# Patient Record
Sex: Male | Born: 1965
Health system: Southern US, Community
[De-identification: ages and names within clinical notes are randomized; demographics above are authoritative.]

## PROBLEM LIST (undated history)

## (undated) DIAGNOSIS — G43909 Migraine, unspecified, not intractable, without status migrainosus: Secondary | ICD-10-CM

## (undated) DIAGNOSIS — M7710 Lateral epicondylitis, unspecified elbow: Secondary | ICD-10-CM

## (undated) HISTORY — PX: APPENDECTOMY: SHX54

## (undated) HISTORY — PX: TONSILLECTOMY: SUR1361

## (undated) HISTORY — PX: OTHER SURGICAL HISTORY: SHX169

---

## 2012-02-02 ENCOUNTER — Other Ambulatory Visit (HOSPITAL_COMMUNITY): Payer: Self-pay | Admitting: Internal Medicine

## 2012-02-02 DIAGNOSIS — R109 Unspecified abdominal pain: Secondary | ICD-10-CM

## 2012-02-06 ENCOUNTER — Ambulatory Visit (HOSPITAL_COMMUNITY)
Admission: RE | Admit: 2012-02-06 | Discharge: 2012-02-06 | Disposition: A | Discharge status: Home / Self Care | Payer: PRIVATE HEALTH INSURANCE | Source: Ambulatory Visit | Attending: Internal Medicine | Admitting: Internal Medicine

## 2012-02-06 DIAGNOSIS — R1031 Right lower quadrant pain: Secondary | ICD-10-CM | POA: Insufficient documentation

## 2012-02-06 DIAGNOSIS — R109 Unspecified abdominal pain: Secondary | ICD-10-CM

## 2013-11-07 ENCOUNTER — Encounter (HOSPITAL_COMMUNITY): Payer: Self-pay | Admitting: Pharmacy Technician

## 2013-11-07 NOTE — H&P (Signed)
  NTS SOAP Note  Vital Signs:  Vitals as of: 4/56/2563: Systolic 893: Diastolic 92: Heart Rate 68: Temp 98.39F: Height 46ft 9in: Weight 185Lbs 0 Ounces: BMI 27.32  BMI : 27.32 kg/m2  Subjective: This 64 Years 85 Months old Male presents for a screening TCS.  Never has had a colonoscopy.  No gi complaints.  No family h/o colon cancer.  Review of Symptoms:  Constitutional:unremarkable   Head:unremarkable    Eyes:unremarkable   Nose/Mouth/Throat:unremarkable Cardiovascular:  unremarkable   Respiratory:unremarkable   Gastrointestinal:  unremarkable   Genitourinary:unremarkable     Musculoskeletal:unremarkable   Skin:unremarkable Hematolgic/Lymphatic:unremarkable     Allergic/Immunologic:unremarkable     Past Medical History:    Reviewed  Past Medical History  Surgical History: appendectomy Medical Problems: unremarkable Allergies: nkda Medications: voltaren, baby asa ultracet   Social History:Reviewed  Social History  Preferred Language: English Race:  White Ethnicity: Not Hispanic / Latino Age: 48 Years 1 Months Marital Status:  M Alcohol: no Recreational drug(s): no   Smoking Status: Unknown if ever smoked reviewed on 11/07/2013 Functional Status reviewed on 11/07/2013 ------------------------------------------------ Bathing: Normal Cooking: Normal Dressing: Normal Driving: Normal Eating: Normal Managing Meds: Normal Oral Care: Normal Shopping: Normal Toileting: Normal Transferring: Normal Walking: Normal Cognitive Status reviewed on 11/07/2013 ------------------------------------------------ Attention: Normal Decision Making: Normal Language: Normal Memory: Normal Motor: Normal Perception: Normal Problem Solving: Normal Visual and Spatial: Normal   Family History:  Reviewed  Family Health History Mother, Deceased; Lung cancer;  Father, Deceased; Cancer unspecified;     Objective  Information: General:  Well appearing, well nourished in no distress. Heart:  RRR, no murmur Lungs:    CTA bilaterally, no wheezes, rhonchi, rales.  Breathing unlabored. Abdomen:Soft, NT/ND, no HSM, no masses.   deferred to procedure  Assessment:Need for screening TCS  Diagnoses: V76.51 Screening for malignant neoplasm of colon (Encounter for screening for malignant neoplasm of colon)  Procedures: 73428 - OFFICE OUTPATIENT NEW 20 MINUTES    Plan:  Scheduled for screening TCS on 11/18/13.     Patient Education:Alternative treatments to surgery were discussed with patient (and family).  Risks and benefits  of procedure including bleeding and perforation were fully explained to the patient (and family) who gave informed consent. Patient/family questions were addressed.  Follow-up:Pending Surgery

## 2013-11-17 ENCOUNTER — Encounter (HOSPITAL_COMMUNITY): Payer: Self-pay | Admitting: *Deleted

## 2013-11-18 ENCOUNTER — Encounter (HOSPITAL_COMMUNITY): Payer: Self-pay | Admitting: *Deleted

## 2013-11-18 ENCOUNTER — Encounter (HOSPITAL_COMMUNITY)
Admission: RE | Disposition: A | Discharge status: Home / Self Care | Payer: Self-pay | Source: Ambulatory Visit | Attending: General Surgery

## 2013-11-18 ENCOUNTER — Ambulatory Visit (HOSPITAL_COMMUNITY)
Admission: RE | Admit: 2013-11-18 | Discharge: 2013-11-18 | Disposition: A | Discharge status: Home / Self Care | Payer: BC Managed Care – PPO | Source: Ambulatory Visit | Attending: General Surgery | Admitting: General Surgery

## 2013-11-18 DIAGNOSIS — D126 Benign neoplasm of colon, unspecified: Secondary | ICD-10-CM | POA: Insufficient documentation

## 2013-11-18 DIAGNOSIS — Z7982 Long term (current) use of aspirin: Secondary | ICD-10-CM | POA: Insufficient documentation

## 2013-11-18 DIAGNOSIS — Z1211 Encounter for screening for malignant neoplasm of colon: Secondary | ICD-10-CM | POA: Insufficient documentation

## 2013-11-18 HISTORY — DX: Migraine, unspecified, not intractable, without status migrainosus: G43.909

## 2013-11-18 HISTORY — PX: COLONOSCOPY: SHX5424

## 2013-11-18 HISTORY — DX: Lateral epicondylitis, unspecified elbow: M77.10

## 2013-11-18 SURGERY — COLONOSCOPY
Anesthesia: Moderate Sedation

## 2013-11-18 MED ORDER — MEPERIDINE HCL 50 MG/ML IJ SOLN
INTRAMUSCULAR | Status: DC | PRN
  Administered 2013-11-18: 50 mg via INTRAVENOUS

## 2013-11-18 MED ORDER — MIDAZOLAM HCL 5 MG/5ML IJ SOLN
INTRAMUSCULAR | Status: DC | PRN
  Administered 2013-11-18: 1 mg via INTRAVENOUS
  Administered 2013-11-18: 4 mg via INTRAVENOUS

## 2013-11-18 MED ORDER — STERILE WATER FOR IRRIGATION IR SOLN
Status: DC | PRN
  Administered 2013-11-18: 09:00:00

## 2013-11-18 MED ORDER — MEPERIDINE HCL 50 MG/ML IJ SOLN
INTRAMUSCULAR | Status: AC
  Filled 2013-11-18: qty 1

## 2013-11-18 MED ORDER — MIDAZOLAM HCL 5 MG/5ML IJ SOLN
INTRAMUSCULAR | Status: AC
  Filled 2013-11-18: qty 5

## 2013-11-18 MED ORDER — SODIUM CHLORIDE 0.9 % IV SOLN
INTRAVENOUS | Status: DC
  Administered 2013-11-18: 08:00:00 via INTRAVENOUS

## 2013-11-18 NOTE — Interval H&P Note (Signed)
History and Physical Interval Note:  11/18/2013 8:27 AM  Roger Wade  has presented today for surgery, with the diagnosis of screening  The various methods of treatment have been discussed with the patient and family. After consideration of risks, benefits and other options for treatment, the patient has consented to  Procedure(s): COLONOSCOPY (N/A) as a surgical intervention .  The patient's history has been reviewed, patient examined, no change in status, stable for surgery.  I have reviewed the patient's chart and labs.  Questions were answered to the patient's satisfaction.     Aviva Signs A

## 2013-11-18 NOTE — Op Note (Signed)
[facility] [address] [place], [address]   COLONOSCOPY PROCEDURE REPORT  PATIENT: Roger, Wade  MR#: [id number] BIRTHDATE: Oct 14, 1965 , 75  yrs. old GENDER: Male ENDOSCOPIST: Aviva Signs, MD REFERRED BY:  Sharilyn Sites, MD PROCEDURE DATE:  11/18/2013 PROCEDURE:   Colonoscopy with snare polypectomy ASA CLASS:   Class I INDICATIONS:Average risk patient for colon cancer. MEDICATIONS: Versed 5 mg IV and Demerol 50 mg IV  DESCRIPTION OF PROCEDURE:   After the risks benefits and alternatives of the procedure were thoroughly explained, informed consent was obtained.  A digital rectal exam revealed no abnormalities of the rectum.   The EC-[id number] ([id number])  endoscope was introduced through the anus and advanced to the cecum, which was identified by both the appendix and ileocecal valve. No adverse events experienced.   The quality of the prep was adequate, using MoviPrep  The instrument was then slowly withdrawn as the colon was fully examined.      COLON FINDINGS: A sessile polyp ranging between 3-77mm in size with a friable surface was found in the ascending colon.  A polypectomy was performed using snare cautery.  The resection was complete and the polyp tissue was not retrieved.   The colon mucosa was otherwise normal.  Retroflexed views revealed no abnormalities. The time to cecum=4 minutes 0 seconds.  Withdrawal time=8 minutes 0 seconds.  The scope was withdrawn and the procedure completed. COMPLICATIONS: There were no complications.  ENDOSCOPIC IMPRESSION: 1.   Sessile polyp ranging between 3-59mm in size was found in the ascending colon; polypectomy was performed using snare cautery 2.   The colon mucosa was otherwise normal  RECOMMENDATIONS: Repeat Colonoscopy in 3 years.   eSigned:  Aviva Signs, MD 11/18/2013 8:50 AM   cc:

## 2013-11-18 NOTE — Discharge Instructions (Signed)
Colonoscopy, Care After °Refer to this sheet in the next few weeks. These instructions provide you with information on caring for yourself after your procedure. Your health care provider may also give you more specific instructions. Your treatment has been planned according to current medical practices, but problems sometimes occur. Call your health care provider if you have any problems or questions after your procedure. °WHAT TO EXPECT AFTER THE PROCEDURE  °After your procedure, it is typical to have the following: °· A small amount of blood in your stool. °· Moderate amounts of gas and mild abdominal cramping or bloating. °HOME CARE INSTRUCTIONS °· Do not drive, operate machinery, or sign important documents for 24 hours. °· You may shower and resume your regular physical activities, but move at a slower pace for the first 24 hours. °· Take frequent rest periods for the first 24 hours. °· Walk around or put a warm pack on your abdomen to help reduce abdominal cramping and bloating. °· Drink enough fluids to keep your urine clear or pale yellow. °· You may resume your normal diet as instructed by your health care provider. Avoid heavy or fried foods that are hard to digest. °· Avoid drinking alcohol for 24 hours or as instructed by your health care provider. °· Only take over-the-counter or prescription medicines as directed by your health care provider. °· If a tissue sample (biopsy) was taken during your procedure: °· Do not take aspirin or blood thinners for 7 days, or as instructed by your health care provider. °· Do not drink alcohol for 7 days, or as instructed by your health care provider. °· Eat soft foods for the first 24 hours. °SEEK MEDICAL CARE IF: °You have persistent spotting of blood in your stool 2 3 days after the procedure. °SEEK IMMEDIATE MEDICAL CARE IF: °· You have more than a small spotting of blood in your stool. °· You pass large blood clots in your stool. °· Your abdomen is swollen  (distended). °· You have nausea or vomiting. °· You have a fever. °· You have increasing abdominal pain that is not relieved with medicine. °Document Released: 03/28/2004 Document Revised: 06/04/2013 Document Reviewed: 04/21/2013 °ExitCare® Patient Information ©2014 [employer]. ° °

## 2013-11-26 ENCOUNTER — Encounter (HOSPITAL_COMMUNITY): Payer: Self-pay | Admitting: General Surgery

## 2014-02-21 ENCOUNTER — Emergency Department (HOSPITAL_COMMUNITY)
Admission: EM | Admit: 2014-02-21 | Discharge: 2014-02-22 | Disposition: A | Discharge status: Other Acute Inpatient Hospital | Payer: BC Managed Care – PPO | Attending: Emergency Medicine | Admitting: Emergency Medicine

## 2014-02-21 ENCOUNTER — Encounter (HOSPITAL_COMMUNITY): Payer: Self-pay | Admitting: Emergency Medicine

## 2014-02-21 DIAGNOSIS — R45851 Suicidal ideations: Secondary | ICD-10-CM

## 2014-02-21 DIAGNOSIS — R51 Headache: Secondary | ICD-10-CM | POA: Insufficient documentation

## 2014-02-21 DIAGNOSIS — Z8679 Personal history of other diseases of the circulatory system: Secondary | ICD-10-CM | POA: Insufficient documentation

## 2014-02-21 DIAGNOSIS — Z791 Long term (current) use of non-steroidal anti-inflammatories (NSAID): Secondary | ICD-10-CM | POA: Insufficient documentation

## 2014-02-21 DIAGNOSIS — Z8739 Personal history of other diseases of the musculoskeletal system and connective tissue: Secondary | ICD-10-CM | POA: Insufficient documentation

## 2014-02-21 LAB — CBC WITH DIFFERENTIAL/PLATELET
Basophils Absolute: 0 10*3/uL (ref 0.0–0.1)
Basophils Relative: 0 % (ref 0–1)
EOS ABS: 0 10*3/uL (ref 0.0–0.7)
EOS PCT: 1 % (ref 0–5)
HCT: 43.7 % (ref 39.0–52.0)
HEMOGLOBIN: 15.1 g/dL (ref 13.0–17.0)
LYMPHS ABS: 1 10*3/uL (ref 0.7–4.0)
Lymphocytes Relative: 19 % (ref 12–46)
MCH: 29.3 pg (ref 26.0–34.0)
MCHC: 34.6 g/dL (ref 30.0–36.0)
MCV: 84.9 fL (ref 78.0–100.0)
MONO ABS: 0.3 10*3/uL (ref 0.1–1.0)
MONOS PCT: 5 % (ref 3–12)
NEUTROS PCT: 75 % (ref 43–77)
Neutro Abs: 3.8 10*3/uL (ref 1.7–7.7)
Platelets: 214 10*3/uL (ref 150–400)
RBC: 5.15 MIL/uL (ref 4.22–5.81)
RDW: 12.8 % (ref 11.5–15.5)
WBC: 5.1 10*3/uL (ref 4.0–10.5)

## 2014-02-21 LAB — BASIC METABOLIC PANEL
BUN: 10 mg/dL (ref 6–23)
CHLORIDE: 99 meq/L (ref 96–112)
CO2: 32 mEq/L (ref 19–32)
CREATININE: 0.94 mg/dL (ref 0.50–1.35)
Calcium: 9.2 mg/dL (ref 8.4–10.5)
GFR calc Af Amer: >90 mL/min (ref >90)
GFR calc non Af Amer: >90 mL/min (ref >90)
Glucose, Bld: 119 mg/dL — ABNORMAL HIGH (ref 70–99)
Potassium: 4.4 mEq/L (ref 3.7–5.3)
SODIUM: 140 meq/L (ref 137–147)

## 2014-02-21 LAB — RAPID URINE DRUG SCREEN, HOSP PERFORMED
Amphetamines: NOT DETECTED
Barbiturates: NOT DETECTED
Benzodiazepines: NOT DETECTED
COCAINE: NOT DETECTED
Opiates: NOT DETECTED
TETRAHYDROCANNABINOL: NOT DETECTED

## 2014-02-21 NOTE — ED Notes (Signed)
Son and male family member in to visit patient.  Patient requested getting his cell phone to give to his son, which I did.

## 2014-02-21 NOTE — ED Notes (Signed)
Pastor in to visit patient.

## 2014-02-21 NOTE — ED Notes (Signed)
Patient reports SI. Per patient past hx of SI. Patient reports trigger is martial problems. Denies any SI. Patient reports planned to shot himself.

## 2014-02-21 NOTE — ED Notes (Signed)
Asked patient if he felt he needed sleep aid and he declined.

## 2014-02-21 NOTE — ED Provider Notes (Signed)
CSN: [id number]     Arrival date & time 02/21/14  6213 History  This chart was scribed for Fredia Sorrow, MD by Martinique Peace, ED Scribe. The patient was seen in APA16A/APA16A. The patient's care was started at 11:53 AM.    Chief Complaint  Patient presents with  . V70.1      The history is provided by the patient. No language interpreter was used.   HPI Comments: Login xxx Magnan is a 48 y.o. male with history or SI presents to the Emergency Department complaining of new suicidal thoughts onset today which he reports is triggered over problems with his marriage. Pt states that he wants to shoot himself. He reports having similar suicidal ideations in the past specifically about a month ago when he was having thoughts of shooting himself as well. Pt denies any intentions or thoughts about hurting anyone.    Past Medical History  Diagnosis Date  . Migraines   . Tennis elbow    Past Surgical History  Procedure Laterality Date  . Tonsillectomy      at age 35  . Appendectomy    . Exploratory laparotomy with appendectomy    . Colonoscopy N/A 11/18/2013    Procedure: COLONOSCOPY;  Surgeon: Jamesetta So, MD;  Location: [facility];  Service: Gastroenterology;  Laterality: N/A;   Family History  Problem Relation Age of Onset  . Cancer Father    History  Substance Use Topics  . Smoking status: Never Smoker   . Smokeless tobacco: Never Used  . Alcohol Use: No    Review of Systems  Constitutional: Negative for fever and chills.  HENT: Negative for rhinorrhea and sore throat.   Respiratory: Negative for cough and shortness of breath.   Cardiovascular: Negative for chest pain.  Gastrointestinal: Negative for nausea, vomiting and diarrhea.  Genitourinary: Negative for dysuria and hematuria.  Musculoskeletal: Negative for back pain and neck pain.  Skin: Negative for rash.  Neurological: Positive for headaches.  Hematological: Does not bruise/bleed easily.   Psychiatric/Behavioral: Positive for suicidal ideas.      Allergies  Review of patient's allergies indicates no known allergies.  Home Medications   Prior to Admission medications   Medication Sig Start Date End Date Taking? Authorizing Provider  ibuprofen (ADVIL,MOTRIN) 200 MG tablet Take 400-600 mg by mouth daily as needed for headache.   Yes Historical Provider, MD  pseudoephedrine-acetaminophen (TYLENOL SINUS) 30-500 MG TABS Take 2 tablets by mouth daily as needed (sinus headache.).   Yes Historical Provider, MD   Triage Vitals: BP 160/111  Pulse 68  Resp 19  Ht 5\' 9"  (1.753 m)  Wt 170 lb (77.111 kg)  BMI 25.09 kg/m2  SpO2 100% Physical Exam  Nursing note and vitals reviewed. Constitutional: He is oriented to person, place, and time. He appears well-developed and well-nourished.  HENT:  Head: Normocephalic and atraumatic.  Mouth/Throat: Oropharynx is clear and moist.  Eyes: Conjunctivae and EOM are normal.  Neck: Normal range of motion. Neck supple. No tracheal deviation present.  Cardiovascular: Normal rate, regular rhythm and normal heart sounds.   No murmur heard. Pulmonary/Chest: Effort normal. No respiratory distress.  Abdominal: Soft. Bowel sounds are normal. There is no tenderness.  Musculoskeletal: Normal range of motion.  Neurological: He is alert and oriented to person, place, and time. No cranial nerve deficit. He exhibits normal muscle tone. Coordination normal.  Skin: Skin is warm and dry. He is not diaphoretic.    ED Course  Procedures (including critical  care time) DIAGNOSTIC STUDIES: Oxygen Saturation is 100% on room air, normal by my interpretation.    COORDINATION OF CARE: 11:58 PM- Treatment plan was discussed with patient who verbalizes understanding and agrees.     Labs Review Labs Reviewed  BASIC METABOLIC PANEL - Abnormal; Notable for the following:    Glucose, Bld 119 (*)    All other components within normal limits  CBC WITH  DIFFERENTIAL  URINE RAPID DRUG SCREEN (HOSP PERFORMED)   Results for orders placed during the hospital encounter of 02/21/14  CBC WITH DIFFERENTIAL      Result Value Ref Range   WBC 5.1  4.0 - 10.5 K/uL   RBC 5.15  4.22 - 5.81 MIL/uL   Hemoglobin 15.1  13.0 - 17.0 g/dL   HCT 43.7  39.0 - 52.0 %   MCV 84.9  78.0 - 100.0 fL   MCH 29.3  26.0 - 34.0 pg   MCHC 34.6  30.0 - 36.0 g/dL   RDW 12.8  11.5 - 15.5 %   Platelets 214  150 - 400 K/uL   Neutrophils Relative % 75  43 - 77 %   Neutro Abs 3.8  1.7 - 7.7 K/uL   Lymphocytes Relative 19  12 - 46 %   Lymphs Abs 1.0  0.7 - 4.0 K/uL   Monocytes Relative 5  3 - 12 %   Monocytes Absolute 0.3  0.1 - 1.0 K/uL   Eosinophils Relative 1  0 - 5 %   Eosinophils Absolute 0.0  0.0 - 0.7 K/uL   Basophils Relative 0  0 - 1 %   Basophils Absolute 0.0  0.0 - 0.1 K/uL  URINE RAPID DRUG SCREEN (HOSP PERFORMED)      Result Value Ref Range   Opiates NONE DETECTED  NONE DETECTED   Cocaine NONE DETECTED  NONE DETECTED   Benzodiazepines NONE DETECTED  NONE DETECTED   Amphetamines NONE DETECTED  NONE DETECTED   Tetrahydrocannabinol NONE DETECTED  NONE DETECTED   Barbiturates NONE DETECTED  NONE DETECTED  BASIC METABOLIC PANEL      Result Value Ref Range   Sodium 140  137 - 147 mEq/L   Potassium 4.4  3.7 - 5.3 mEq/L   Chloride 99  96 - 112 mEq/L   CO2 32  19 - 32 mEq/L   Glucose, Bld 119 (*) 70 - 99 mg/dL   BUN 10  6 - 23 mg/dL   Creatinine, Ser 0.94  0.50 - 1.35 mg/dL   Calcium 9.2  8.4 - 10.5 mg/dL   GFR calc non Af Amer >90  >90 mL/min   GFR calc Af Amer >90  >90 mL/min    Imaging Review No results found.   EKG Interpretation None     Medications - No data to display  MDM   Final diagnoses:  Suicidal ideation     Patient medically cleared. Patient with significant suicidal thoughts with plan. Behavioral health to see the patient. Will most likely require admission. Patient is voluntary.   I personally performed the services  described in this documentation, which was scribed in my presence. The recorded information has been reviewed and is accurate.     Fredia Sorrow, MD 02/21/14 1308

## 2014-02-21 NOTE — ED Notes (Signed)
Pt reports SI due to "maritial problems". Pt states he has a gun which he would use to kill himself. Pt states hx of similar thoughts. Pt also states he took "depression pills" to help but denies any changes in mood/behavior. Pt is calm and cooperative in room.

## 2014-02-21 NOTE — BH Assessment (Signed)
[facility] Assessment Progress Note  Took history from Dr. Rogene Houston and set tele assessment appt for 1:20.

## 2014-02-21 NOTE — Progress Notes (Signed)
BWomble, MHT completed placement search by contacting the following facilities listed below; [facility] [place] [facility] [place] [facility] [place] at capacity [facility] no answer [place] at [employer] at [employer] at [employer] at capacity [facility] at [place] faxed referral [facility] faxed referral [facility] faxed referral [facility] faxed referral

## 2014-02-21 NOTE — ED Notes (Signed)
Telepsych in progress. 

## 2014-02-21 NOTE — BH Assessment (Signed)
Assessment Note  Roger Wade is an 48 y.o. male who came in to [place] voluntarily with law enforcement after his wife called the police due to suicide threats pt sent in a text.  Pt lives in a home with his wife and 104 year old son, and states that he has been having marital problems since March when pt found out that his wife was cheating on him.  Pt said that he has been getting angry and upset with his wife since then and that he took some meds (he does not remember what) that were supposed to help prescribed by his PCP, but they did not help, so he stopped taking them.  PT denies HI, A/V hallucinations, history of violence, or any psych history.  He endorses symptoms of depression and says he has been having trouble sleeping and has lost about 10 pounds since March.  He says that he feels suicidal off and on when he thinks about the possibility of things not working out with his wife.  They have been seeing a marital counselor in [place] off and on since March and also their church pastor.  Today, pt says they got into "a fuss", and his wife told him things were over. He later tried to call and text her and when she did not respond, he texted that she "could either talk to me or bury me".  Pt has access to a gun in his home and said that when he has SI, he thinks of shooting himself.  The thing that stops him from doing it is the thought that things might work out with his wife.    Pt is calm, polite and cooperative during assessment and understands the placement process.  He says he is willing to come in for treatment.  Reginold Agent, NP recommends IP treatment, and Dr. Rogene Houston agrees with disposition.  TTS will seek placement.    Axis I: Major Depression, single episode Axis II: Deferred Axis III:  Past Medical History  Diagnosis Date  . Migraines   . Tennis elbow    Axis IV: problems with primary support group Axis V: 31-40 impairment in reality testing  Past Medical History:  Past  Medical History  Diagnosis Date  . Migraines   . Tennis elbow     Past Surgical History  Procedure Laterality Date  . Tonsillectomy      at age 29  . Appendectomy    . Exploratory laparotomy with appendectomy    . Colonoscopy N/A 11/18/2013    Procedure: COLONOSCOPY;  Surgeon: Jamesetta So, MD;  Location: [facility];  Service: Gastroenterology;  Laterality: N/A;    Family History:  Family History  Problem Relation Age of Onset  . Cancer Father     Social History:  reports that he has never smoked. He has never used smokeless tobacco. He reports that he does not drink alcohol or use illicit drugs.  Additional Social History:  Alcohol / Drug Use Pain Medications: denies Prescriptions: denies Over the Counter: denies History of alcohol / drug use?: No history of alcohol / drug abuse Longest period of sobriety (when/how long): denies  CIWA: CIWA-Ar BP: 160/111 mmHg Pulse Rate: 68 COWS:    Allergies: No Known Allergies  Home Medications:  (Not in a hospital admission)  OB/GYN Status:  No LMP for male patient.  General Assessment Data Location of Assessment: [facility] ED Is this a Tele or Face-to-Face Assessment?: Tele Assessment Is this an Initial Assessment or a  Re-assessment for this encounter?: Initial Assessment Living Arrangements: Spouse/significant other Can pt return to current living arrangement?: Yes Admission Status: Voluntary Is patient capable of signing voluntary admission?: Yes Transfer from: Home Referral Source: Self/Family/Friend     [place] Living Arrangements: Spouse/significant other Name of Psychiatrist: none Name of Therapist: none  Education Status Is patient currently in school?: No  Risk to self Suicidal Ideation: Yes-Currently Present Suicidal Intent: No Is patient at risk for suicide?: Yes Suicidal Plan?: Yes-Currently Present Specify Current Suicidal Plan: shoot himself Access to Means: Yes Specify Access to  Suicidal Means: gun in the home What has been your use of drugs/alcohol within the last 12 months?: none Previous Attempts/Gestures: No Other Self Harm Risks:  (none known) Intentional Self Injurious Behavior: None Family Suicide History: No Recent stressful life event(s): Conflict (Comment) (marital--wife cheated on him) Persecutory voices/beliefs?: No Depression: Yes Depression Symptoms: Insomnia;Tearfulness;Fatigue;Guilt;Loss of interest in usual pleasures;Feeling worthless/self pity;Feeling angry/irritable Substance abuse history and/or treatment for substance abuse?: No Suicide prevention information given to non-admitted patients: Not applicable  Risk to Others Homicidal Ideation: No Thoughts of Harm to Others: No Current Homicidal Intent: No Current Homicidal Plan: No Access to Homicidal Means: Yes Describe Access to Homicidal Means:  (gun in home) History of harm to others?: No Assessment of Violence: None Noted Does patient have access to weapons?: Yes (Comment) (gun) Criminal Charges Pending?: No Does patient have a court date: No  Psychosis Hallucinations: None noted Delusions: None noted  Mental Status Report Appear/Hygiene:  (casual) Eye Contact: Good Motor Activity: Unremarkable Speech: Logical/coherent Level of Consciousness: Alert Mood: Depressed;Sad;Despair;Ashamed/humiliated;Worthless, low self-esteem Affect: Depressed;Sad Anxiety Level: None Thought Processes: Coherent;Relevant Judgement: Unimpaired Orientation: Person;Place;Time;Situation Obsessive Compulsive Thoughts/Behaviors: None  Cognitive Functioning Concentration: Normal Memory: Recent Intact;Remote Intact IQ: Average Insight: Fair Impulse Control: Fair Appetite: Fair Weight Loss: 10 Weight Gain: 0 Sleep: Decreased Total Hours of Sleep: 4 Vegetative Symptoms: None  ADLScreening [facility] Assessment Services) Patient's cognitive ability adequate to safely complete daily activities?:  Yes Patient able to express need for assistance with ADLs?: Yes Independently performs ADLs?: Yes (appropriate for developmental age)  Prior Inpatient Therapy Prior Inpatient Therapy: No  Prior Outpatient Therapy Prior Outpatient Therapy: Yes Prior Therapy Dates:  (off and on since March) Prior Therapy Facilty/Provider(s):  (a therapist in [place] and their pastor) Reason for Treatment: marital problems  ADL Screening (condition at time of admission) Patient's cognitive ability adequate to safely complete daily activities?: Yes Is the patient deaf or have difficulty hearing?: No Does the patient have difficulty seeing, even when wearing glasses/contacts?: No Does the patient have difficulty concentrating, remembering, or making decisions?: No Patient able to express need for assistance with ADLs?: Yes Does the patient have difficulty dressing or bathing?: No Independently performs ADLs?: Yes (appropriate for developmental age) Does the patient have difficulty walking or climbing stairs?: No  Home Assistive Devices/Equipment Home Assistive Devices/Equipment: None    Abuse/Neglect Assessment (Assessment to be complete while patient is alone) Physical Abuse: Denies Verbal Abuse: Denies Sexual Abuse: Denies Exploitation of patient/patient's resources: Denies Self-Neglect: Denies Values / Beliefs Cultural Requests During Hospitalization: None Spiritual Requests During Hospitalization: None   Advance Directives (For Healthcare) Advance Directive: Patient does not have advance directive Pre-existing out of facility DNR order (yellow form or pink MOST form): No    Additional Information 1:1 In Past 12 Months?: No CIRT Risk: No Elopement Risk: No Does patient have medical clearance?: Yes     Disposition:  Disposition Initial Assessment Completed  for this Encounter: Yes Disposition of Patient: Inpatient treatment program Type of inpatient treatment program: Adult  On Site  Evaluation by:   Reviewed with Physician:    Sheliah Hatch 02/21/2014 2:29 PM

## 2014-02-22 NOTE — ED Provider Notes (Signed)
  Physical Exam  BP 159/97  Pulse 68  Temp(Src) 98.3 F (36.8 C) (Oral)  Resp 20  Ht 5\' 9"  (1.753 m)  Wt 170 lb (77.111 kg)  BMI 25.09 kg/m2  SpO2 99%  Physical Exam  ED Course  Procedures  MDM Accepted at [facility] by Dr. Allegra Grana R. Alvino Chapel, MD 02/22/14 [phone]

## 2016-01-25 ENCOUNTER — Encounter (HOSPITAL_COMMUNITY): Payer: Self-pay | Admitting: Emergency Medicine

## 2016-01-25 ENCOUNTER — Emergency Department (HOSPITAL_COMMUNITY)
Admission: EM | Admit: 2016-01-25 | Discharge: 2016-01-25 | Disposition: A | Discharge status: Home / Self Care | Payer: Worker's Compensation | Attending: Emergency Medicine | Admitting: Emergency Medicine

## 2016-01-25 DIAGNOSIS — Z7982 Long term (current) use of aspirin: Secondary | ICD-10-CM | POA: Insufficient documentation

## 2016-01-25 DIAGNOSIS — S51812A Laceration without foreign body of left forearm, initial encounter: Secondary | ICD-10-CM | POA: Insufficient documentation

## 2016-01-25 DIAGNOSIS — Z79899 Other long term (current) drug therapy: Secondary | ICD-10-CM | POA: Insufficient documentation

## 2016-01-25 DIAGNOSIS — Y929 Unspecified place or not applicable: Secondary | ICD-10-CM | POA: Diagnosis not present

## 2016-01-25 DIAGNOSIS — Z791 Long term (current) use of non-steroidal anti-inflammatories (NSAID): Secondary | ICD-10-CM | POA: Diagnosis not present

## 2016-01-25 DIAGNOSIS — Y9389 Activity, other specified: Secondary | ICD-10-CM | POA: Insufficient documentation

## 2016-01-25 DIAGNOSIS — Y999 Unspecified external cause status: Secondary | ICD-10-CM | POA: Diagnosis not present

## 2016-01-25 DIAGNOSIS — W293XXA Contact with powered garden and outdoor hand tools and machinery, initial encounter: Secondary | ICD-10-CM | POA: Insufficient documentation

## 2016-01-25 DIAGNOSIS — S41112A Laceration without foreign body of left upper arm, initial encounter: Secondary | ICD-10-CM

## 2016-01-25 MED ORDER — TETANUS-DIPHTH-ACELL PERTUSSIS 5-2.5-18.5 LF-MCG/0.5 IM SUSP
0.5000 mL | Freq: Once | INTRAMUSCULAR | Status: AC
  Administered 2016-01-25: 0.5 mL via INTRAMUSCULAR
  Filled 2016-01-25: qty 0.5

## 2016-01-25 MED ORDER — LIDOCAINE HCL (PF) 2 % IJ SOLN
INTRAMUSCULAR | Status: AC
  Filled 2016-01-25: qty 10

## 2016-01-25 MED ORDER — DOUBLE ANTIBIOTIC 500-10000 UNIT/GM EX OINT
TOPICAL_OINTMENT | Freq: Once | CUTANEOUS | Status: AC
  Administered 2016-01-25: 1 via TOPICAL
  Filled 2016-01-25: qty 1

## 2016-01-25 MED ORDER — CEPHALEXIN 500 MG PO CAPS: 500.0000 mg | ORAL_CAPSULE | Freq: Four times a day (QID) | ORAL | Status: AC

## 2016-01-25 NOTE — Discharge Instructions (Signed)
[name],  Nice meeting you! Please follow-up with your primary care provider within 10 days to have your sutures removed. Return to the emergency department if you develop fevers, chills, redness around the wound, nausea/vomiting, yellow/green drainage. Feel better soon!  S. Wendie Simmer, PA-C  Laceration Care, Adult A laceration is a cut that goes through all layers of the skin. The cut also goes into the tissue that is right under the skin. Some cuts heal on their own. Others need to be closed with stitches (sutures), staples, skin adhesive strips, or wound glue. Taking care of your cut lowers your risk of infection and helps your cut to heal better. HOW TO TAKE CARE OF YOUR CUT For stitches or staples:  Keep the wound clean and dry.  If you were given a bandage (dressing), you should change it at least one time per day or as told by your doctor. You should also change it if it gets wet or dirty.  Keep the wound completely dry for the first 24 hours or as told by your doctor. After that time, you may take a shower or a bath. However, make sure that the wound is not soaked in water until after the stitches or staples have been removed.  Clean the wound one time each day or as told by your doctor:  Wash the wound with soap and water.  Rinse the wound with water until all of the soap comes off.  Pat the wound dry with a clean towel. Do not rub the wound.  After you clean the wound, put a thin layer of antibiotic ointment on it as told by your doctor. This ointment:  Helps to prevent infection.  Keeps the bandage from sticking to the wound.  Have your stitches or staples removed as told by your doctor. If your doctor used skin adhesive strips:   Keep the wound clean and dry.  If you were given a bandage, you should change it at least one time per day or as told by your doctor. You should also change it if it gets dirty or wet.  Do not get the skin adhesive strips wet.  You can take a shower or a bath, but be careful to keep the wound dry.  If the wound gets wet, pat it dry with a clean towel. Do not rub the wound.  Skin adhesive strips fall off on their own. You can trim the strips as the wound heals. Do not remove any strips that are still stuck to the wound. They will fall off after a while. If your doctor used wound glue:  Try to keep your wound dry, but you may briefly wet it in the shower or bath. Do not soak the wound in water, such as by swimming.  After you take a shower or a bath, gently pat the wound dry with a clean towel. Do not rub the wound.  Do not do any activities that will make you really sweaty until the skin glue has fallen off on its own.  Do not apply liquid, cream, or ointment medicine to your wound while the skin glue is still on.  If you were given a bandage, you should change it at least one time per day or as told by your doctor. You should also change it if it gets dirty or wet.  If a bandage is placed over the wound, do not let the tape for the bandage touch the skin glue.  Do not  pick at the glue. The skin glue usually stays on for 5-10 days. Then, it falls off of the skin. General Instructions  To help prevent scarring, make sure to cover your wound with sunscreen whenever you are outside after stitches are removed, after adhesive strips are removed, or when wound glue stays in place and the wound is healed. Make sure to wear a sunscreen of at least 30 SPF.  Take over-the-counter and prescription medicines only as told by your doctor.  If you were given antibiotic medicine or ointment, take or apply it as told by your doctor. Do not stop using the antibiotic even if your wound is getting better.  Do not scratch or pick at the wound.  Keep all follow-up visits as told by your doctor. This is important.  Check your wound every day for signs of infection. Watch for:  Redness, swelling, or pain.  Fluid, blood, or  pus.  Raise (elevate) the injured area above the level of your heart while you are sitting or lying down, if possible. GET HELP IF:  You got a tetanus shot and you have any of these problems at the injection site:  Swelling.  Very bad pain.  Redness.  Bleeding.  You have a fever.  A wound that was closed breaks open.  You notice a bad smell coming from your wound or your bandage.  You notice something coming out of the wound, such as wood or glass.  Medicine does not help your pain.  You have more redness, swelling, or pain at the site of your wound.  You have fluid, blood, or pus coming from your wound.  You notice a change in the color of your skin near your wound.  You need to change the bandage often because fluid, blood, or pus is coming from the wound.  You start to have a new rash.  You start to have numbness around the wound. GET HELP RIGHT AWAY IF:  You have very bad swelling around the wound.  Your pain suddenly gets worse and is very bad.  You notice painful lumps near the wound or on skin that is anywhere on your body.  You have a red streak going away from your wound.  The wound is on your hand or foot and you cannot move a finger or toe like you usually can.  The wound is on your hand or foot and you notice that your fingers or toes look pale or bluish.   This information is not intended to replace advice given to you by your health care provider. Make sure you discuss any questions you have with your health care provider.   Document Released: 01/31/2008 Document Revised: 12/29/2014 Document Reviewed: 08/10/2014 Elsevier Interactive Patient Education 2016 [place], Adult A laceration is a cut that goes through all layers of the skin. The cut also goes into the tissue that is right under the skin. Some cuts heal on their own. Others need to be closed with stitches (sutures), staples, skin adhesive strips, or wound glue.  Taking care of your cut lowers your risk of infection and helps your cut to heal better. HOW TO TAKE CARE OF YOUR CUT For stitches or staples:  Keep the wound clean and dry.  If you were given a bandage (dressing), you should change it at least one time per day or as told by your doctor. You should also change it if it gets wet or dirty.  Keep the  wound completely dry for the first 24 hours or as told by your doctor. After that time, you may take a shower or a bath. However, make sure that the wound is not soaked in water until after the stitches or staples have been removed.  Clean the wound one time each day or as told by your doctor:  Wash the wound with soap and water.  Rinse the wound with water until all of the soap comes off.  Pat the wound dry with a clean towel. Do not rub the wound.  After you clean the wound, put a thin layer of antibiotic ointment on it as told by your doctor. This ointment:  Helps to prevent infection.  Keeps the bandage from sticking to the wound.  Have your stitches or staples removed as told by your doctor. If your doctor used skin adhesive strips:   Keep the wound clean and dry.  If you were given a bandage, you should change it at least one time per day or as told by your doctor. You should also change it if it gets dirty or wet.  Do not get the skin adhesive strips wet. You can take a shower or a bath, but be careful to keep the wound dry.  If the wound gets wet, pat it dry with a clean towel. Do not rub the wound.  Skin adhesive strips fall off on their own. You can trim the strips as the wound heals. Do not remove any strips that are still stuck to the wound. They will fall off after a while. If your doctor used wound glue:  Try to keep your wound dry, but you may briefly wet it in the shower or bath. Do not soak the wound in water, such as by swimming.  After you take a shower or a bath, gently pat the wound dry with a clean towel. Do not  rub the wound.  Do not do any activities that will make you really sweaty until the skin glue has fallen off on its own.  Do not apply liquid, cream, or ointment medicine to your wound while the skin glue is still on.  If you were given a bandage, you should change it at least one time per day or as told by your doctor. You should also change it if it gets dirty or wet.  If a bandage is placed over the wound, do not let the tape for the bandage touch the skin glue.  Do not pick at the glue. The skin glue usually stays on for 5-10 days. Then, it falls off of the skin. General Instructions  To help prevent scarring, make sure to cover your wound with sunscreen whenever you are outside after stitches are removed, after adhesive strips are removed, or when wound glue stays in place and the wound is healed. Make sure to wear a sunscreen of at least 30 SPF.  Take over-the-counter and prescription medicines only as told by your doctor.  If you were given antibiotic medicine or ointment, take or apply it as told by your doctor. Do not stop using the antibiotic even if your wound is getting better.  Do not scratch or pick at the wound.  Keep all follow-up visits as told by your doctor. This is important.  Check your wound every day for signs of infection. Watch for:  Redness, swelling, or pain.  Fluid, blood, or pus.  Raise (elevate) the injured area above the level of your heart while  you are sitting or lying down, if possible. GET HELP IF:  You got a tetanus shot and you have any of these problems at the injection site:  Swelling.  Very bad pain.  Redness.  Bleeding.  You have a fever.  A wound that was closed breaks open.  You notice a bad smell coming from your wound or your bandage.  You notice something coming out of the wound, such as wood or glass.  Medicine does not help your pain.  You have more redness, swelling, or pain at the site of your wound.  You have  fluid, blood, or pus coming from your wound.  You notice a change in the color of your skin near your wound.  You need to change the bandage often because fluid, blood, or pus is coming from the wound.  You start to have a new rash.  You start to have numbness around the wound. GET HELP RIGHT AWAY IF:  You have very bad swelling around the wound.  Your pain suddenly gets worse and is very bad.  You notice painful lumps near the wound or on skin that is anywhere on your body.  You have a red streak going away from your wound.  The wound is on your hand or foot and you cannot move a finger or toe like you usually can.  The wound is on your hand or foot and you notice that your fingers or toes look pale or bluish.   This information is not intended to replace advice given to you by your health care provider. Make sure you discuss any questions you have with your health care provider.   Document Released: 01/31/2008 Document Revised: 12/29/2014 Document Reviewed: 08/10/2014 Elsevier Interactive Patient Education [employer].

## 2016-01-25 NOTE — ED Provider Notes (Signed)
CSN: [id number]     Arrival date & time 01/25/16  1651 History   First MD Initiated Contact with Patient 01/25/16 1734     Chief Complaint  Patient presents with  . Extremity Laceration   HPI   Roger Wade is a 50 y.o. male with no significant PMH presenting with left forearm laceration. He states he was cut by hedge trimmers at work. He denies injury elsewhere, fevers, chills, nausea, vomiting, pain. Bleeding is controlled. He is not up-to-date on his tetanus vaccine.  Past Medical History  Diagnosis Date  . Migraines   . Tennis elbow    Past Surgical History  Procedure Laterality Date  . Tonsillectomy      at age 8  . Appendectomy    . Exploratory laparotomy with appendectomy    . Colonoscopy N/A 11/18/2013    Procedure: COLONOSCOPY;  Surgeon: Jamesetta So, MD;  Location: [facility];  Service: Gastroenterology;  Laterality: N/A;   Family History  Problem Relation Age of Onset  . Cancer Father    Social History  Substance Use Topics  . Smoking status: Never Smoker   . Smokeless tobacco: Never Used  . Alcohol Use: No    Review of Systems  Ten systems are reviewed and are negative for acute change except as noted in the HPI  Allergies  Review of patient's allergies indicates no known allergies.  Home Medications   Prior to Admission medications   Medication Sig Start Date End Date Taking? Authorizing Provider  aspirin EC 81 MG tablet Take 81 mg by mouth daily.   Yes Historical Provider, MD  ibuprofen (ADVIL,MOTRIN) 200 MG tablet Take 400-600 mg by mouth daily as needed for headache.   Yes Historical Provider, MD  Omega-3 Fatty Acids (FISH OIL OMEGA-3 PO) Take 1 capsule by mouth daily.   Yes Historical Provider, MD  cephALEXin (KEFLEX) 500 MG capsule Take 1 capsule (500 mg total) by mouth 4 (four) times daily. 01/25/16   [place] Lions, PA-C   BP 139/98 mmHg  Pulse 87  Temp(Src) 98.6 F (37 C) (Oral)  Resp 18  Ht 5\' 8"  (1.727 m)  Wt 80.74 kg  BMI  27.07 kg/m2  SpO2 95% Physical Exam  Constitutional: He appears well-developed and well-nourished. No distress.  HENT:  Head: Normocephalic and atraumatic.  Eyes: Conjunctivae are normal. Right eye exhibits no discharge. Left eye exhibits no discharge. No scleral icterus.  Neck: No tracheal deviation present.  Cardiovascular: Normal rate, regular rhythm and intact distal pulses.   Pulmonary/Chest: Effort normal and breath sounds normal. No respiratory distress.  Abdominal: Soft. Bowel sounds are normal. He exhibits no distension.  Musculoskeletal: He exhibits no edema.  Lymphadenopathy:    He has no cervical adenopathy.  Neurological: He is alert. Coordination normal.  Skin: Skin is warm and dry. No rash noted. He is not diaphoretic. No erythema.  4 cm laceration and 1 cm linear abrasion anterior left forearm   Psychiatric: He has a normal mood and affect. His behavior is normal.  Nursing note and vitals reviewed.   ED Course  .Marland KitchenLaceration Repair Date/Time: 01/25/2016 6:26 PM Performed by: Alisia Ferrari NICOLE Authorized by: [place] Lions Consent: Verbal consent obtained. Consent given by: patient Patient identity confirmed: verbally with patient and arm band Body area: upper extremity Location details: left lower arm Laceration length: 4 cm Foreign bodies: no foreign bodies Tendon involvement: none Nerve involvement: none Vascular damage: no Anesthesia: local infiltration Local anesthetic: lidocaine 2% with  epinephrine Anesthetic total: 5 ml Patient sedated: no Irrigation solution: saline Irrigation method: syringe Amount of cleaning: standard Skin closure: 5-0 Prolene and 4-0 Prolene Number of sutures: 4 Technique: simple Approximation: close Approximation difficulty: simple Dressing: antibiotic ointment and 4x4 sterile gauze Patient tolerance: Patient tolerated the procedure well with no immediate complications  Irrigation Date/Time: 01/25/2016 6:28  PM Performed by: Alisia Ferrari NICOLE Authorized by: Alisia Ferrari NICOLE Consent: Verbal consent obtained. Risks and benefits: risks, benefits and alternatives were discussed Consent given by: patient Patient identity confirmed: verbally with patient and arm band Local anesthesia used: yes Anesthesia: local infiltration Local anesthetic: lidocaine 2% with epinephrine Patient tolerance: Patient tolerated the procedure well with no immediate complications    MDM   Final diagnoses:  Laceration of upper extremity, left, initial encounter   Pressure irrigation performed. Wound explored and base of wound visualized in a bloodless field without evidence of foreign body.  Laceration occurred < 8 hours prior to repair which was well tolerated. Tdap updated.  Pt has no comorbidities to effect normal wound healing. Pt discharged with antibiotics given hedge clippers were mechanism of injury.  Discussed suture home care with patient and answered questions. Pt to follow-up for wound check and suture removal in 10 days; they are to return to the ED sooner for signs of infection. Pt is hemodynamically stable with no complaints prior to dc.    [place] Lions, PA-C 01/25/16 1829  Merrily Pew, MD 01/27/16 [id number]

## 2016-01-25 NOTE — ED Notes (Signed)
Pt c/o laceration to LT forearm. States he was cut by hedge trimmers at work. Bleeding controlled. Pt not UTD on tetanus vaccine.

## 2017-10-24 DIAGNOSIS — R51 Headache: Secondary | ICD-10-CM | POA: Diagnosis not present

## 2017-11-05 DIAGNOSIS — E663 Overweight: Secondary | ICD-10-CM | POA: Diagnosis not present

## 2017-11-05 DIAGNOSIS — R030 Elevated blood-pressure reading, without diagnosis of hypertension: Secondary | ICD-10-CM | POA: Diagnosis not present

## 2017-11-05 DIAGNOSIS — Z1389 Encounter for screening for other disorder: Secondary | ICD-10-CM | POA: Diagnosis not present

## 2017-11-05 DIAGNOSIS — G43809 Other migraine, not intractable, without status migrainosus: Secondary | ICD-10-CM | POA: Diagnosis not present

## 2017-11-05 DIAGNOSIS — B349 Viral infection, unspecified: Secondary | ICD-10-CM | POA: Diagnosis not present

## 2018-02-21 DIAGNOSIS — Z8249 Family history of ischemic heart disease and other diseases of the circulatory system: Secondary | ICD-10-CM | POA: Diagnosis not present

## 2018-02-21 DIAGNOSIS — Z1389 Encounter for screening for other disorder: Secondary | ICD-10-CM | POA: Diagnosis not present

## 2018-02-21 DIAGNOSIS — N029 Recurrent and persistent hematuria with unspecified morphologic changes: Secondary | ICD-10-CM | POA: Diagnosis not present

## 2018-02-21 DIAGNOSIS — E782 Mixed hyperlipidemia: Secondary | ICD-10-CM | POA: Diagnosis not present

## 2018-02-21 DIAGNOSIS — Z0001 Encounter for general adult medical examination with abnormal findings: Secondary | ICD-10-CM | POA: Diagnosis not present

## 2018-04-01 DIAGNOSIS — Z86010 Personal history of colonic polyps: Secondary | ICD-10-CM | POA: Diagnosis not present

## 2018-04-18 DIAGNOSIS — Z86010 Personal history of colonic polyps: Secondary | ICD-10-CM | POA: Diagnosis not present

## 2018-04-18 DIAGNOSIS — K5730 Diverticulosis of large intestine without perforation or abscess without bleeding: Secondary | ICD-10-CM | POA: Diagnosis not present

## 2018-04-18 DIAGNOSIS — D126 Benign neoplasm of colon, unspecified: Secondary | ICD-10-CM | POA: Diagnosis not present

## 2018-05-06 DIAGNOSIS — R972 Elevated prostate specific antigen [PSA]: Secondary | ICD-10-CM | POA: Diagnosis not present

## 2018-11-04 DIAGNOSIS — R972 Elevated prostate specific antigen [PSA]: Secondary | ICD-10-CM | POA: Diagnosis not present

## 2019-04-16 DIAGNOSIS — E7849 Other hyperlipidemia: Secondary | ICD-10-CM | POA: Diagnosis not present

## 2019-04-16 DIAGNOSIS — Z0000 Encounter for general adult medical examination without abnormal findings: Secondary | ICD-10-CM | POA: Diagnosis not present

## 2019-04-16 DIAGNOSIS — Z1389 Encounter for screening for other disorder: Secondary | ICD-10-CM | POA: Diagnosis not present

## 2019-04-16 DIAGNOSIS — Z6826 Body mass index (BMI) 26.0-26.9, adult: Secondary | ICD-10-CM | POA: Diagnosis not present

## 2019-04-16 DIAGNOSIS — G43809 Other migraine, not intractable, without status migrainosus: Secondary | ICD-10-CM | POA: Diagnosis not present

## 2019-04-16 DIAGNOSIS — I10 Essential (primary) hypertension: Secondary | ICD-10-CM | POA: Diagnosis not present

## 2019-05-19 DIAGNOSIS — R972 Elevated prostate specific antigen [PSA]: Secondary | ICD-10-CM | POA: Diagnosis not present

## 2019-08-06 ENCOUNTER — Encounter (HOSPITAL_COMMUNITY): Payer: Self-pay | Admitting: *Deleted

## 2019-08-06 ENCOUNTER — Emergency Department (HOSPITAL_COMMUNITY): Payer: BC Managed Care – PPO

## 2019-08-06 ENCOUNTER — Other Ambulatory Visit: Payer: Self-pay

## 2019-08-06 ENCOUNTER — Emergency Department (HOSPITAL_COMMUNITY)
Admission: EM | Admit: 2019-08-06 | Discharge: 2019-08-06 | Disposition: A | Discharge status: Home / Self Care | Payer: BC Managed Care – PPO | Attending: Emergency Medicine | Admitting: Emergency Medicine

## 2019-08-06 DIAGNOSIS — S61412A Laceration without foreign body of left hand, initial encounter: Secondary | ICD-10-CM

## 2019-08-06 DIAGNOSIS — Y9259 Other trade areas as the place of occurrence of the external cause: Secondary | ICD-10-CM | POA: Diagnosis not present

## 2019-08-06 DIAGNOSIS — S61012A Laceration without foreign body of left thumb without damage to nail, initial encounter: Secondary | ICD-10-CM | POA: Diagnosis not present

## 2019-08-06 DIAGNOSIS — Y990 Civilian activity done for income or pay: Secondary | ICD-10-CM | POA: Insufficient documentation

## 2019-08-06 DIAGNOSIS — Z7982 Long term (current) use of aspirin: Secondary | ICD-10-CM | POA: Diagnosis not present

## 2019-08-06 DIAGNOSIS — W228XXA Striking against or struck by other objects, initial encounter: Secondary | ICD-10-CM | POA: Insufficient documentation

## 2019-08-06 DIAGNOSIS — Y9389 Activity, other specified: Secondary | ICD-10-CM | POA: Diagnosis not present

## 2019-08-06 DIAGNOSIS — Z79899 Other long term (current) drug therapy: Secondary | ICD-10-CM | POA: Diagnosis not present

## 2019-08-06 MED ORDER — LIDOCAINE HCL (PF) 1 % IJ SOLN
5.0000 mL | Freq: Once | INTRAMUSCULAR | Status: DC
  Filled 2019-08-06: qty 6

## 2019-08-06 NOTE — ED Provider Notes (Signed)
[facility] EMERGENCY DEPARTMENT Provider Note   CSN: [id number] Arrival date & time: 08/06/19  1603     History   Chief Complaint Chief Complaint  Patient presents with  . Laceration    HPI Roger Wade is a 53 y.o. male otherwise healthy presents today for laceration of the webspace between the left thumb and index finger.  Patient reports injury occurred 3-4 hours ago while at work, he was moving a rack when it slipped through his hand causing the laceration on a sharp edge.  He reports a mild sharp pain constant gradually improving nonradiating that has almost completely resolved at this point.  He controlled bleeding with direct pressure prior to arrival.  He denies any other injury today.  Denies fever/chills, headache, neck pain, chest pain/shortness of breath, arthralgias, numbness/weakness, tingling or any additional concerns today.     HPI  Past Medical History:  Diagnosis Date  . Migraines   . Tennis elbow     There are no active problems to display for this patient.   Past Surgical History:  Procedure Laterality Date  . APPENDECTOMY    . COLONOSCOPY N/A 11/18/2013   Procedure: COLONOSCOPY;  Surgeon: Jamesetta So, MD;  Location: [facility];  Service: Gastroenterology;  Laterality: N/A;  . Exploratory Laparotomy with Appendectomy    . TONSILLECTOMY     at age 44        Home Medications    Prior to Admission medications   Medication Sig Start Date End Date Taking? Authorizing Provider  aspirin EC 81 MG tablet Take 81 mg by mouth daily.    [provider]  cephALEXin (KEFLEX) 500 MG capsule Take 1 capsule (500 mg total) by mouth 4 (four) times daily. 01/25/16   [place] Lions, PA-C  ibuprofen (ADVIL,MOTRIN) 200 MG tablet Take 400-600 mg by mouth daily as needed for headache.    [provider]  Omega-3 Fatty Acids (FISH OIL OMEGA-3 PO) Take 1 capsule by mouth daily.    [provider]    Family History Family  History  Problem Relation Age of Onset  . Cancer Father     Social History Social History   Tobacco Use  . Smoking status: Never Smoker  . Smokeless tobacco: Never Used  Substance Use Topics  . Alcohol use: No  . Drug use: No     Allergies   Patient has no known allergies.   Review of Systems Review of Systems Ten systems are reviewed and are negative for acute change except as noted in the HPI   Physical Exam Updated Vital Signs BP (!) 139/102 (BP Location: Right Arm)   Pulse 78   Temp 98.7 F (37.1 C) (Oral)   Resp 20   Ht 5' 8.5" (1.74 m)   Wt 83.9 kg   SpO2 99%   BMI 27.72 kg/m   Physical Exam Constitutional:      General: He is not in acute distress.    Appearance: Normal appearance. He is well-developed. He is not ill-appearing or diaphoretic.  HENT:     Head: Normocephalic and atraumatic.     Right Ear: External ear normal.     Left Ear: External ear normal.     Nose: Nose normal.  Eyes:     General: Vision grossly intact. Gaze aligned appropriately.     Pupils: Pupils are equal, round, and reactive to light.  Neck:     Musculoskeletal: Normal range of motion.  Trachea: Trachea and phonation normal. No tracheal deviation.  Cardiovascular:     Rate and Rhythm: Normal rate and regular rhythm.     Pulses:          Radial pulses are 2+ on the right side and 2+ on the left side.  Pulmonary:     Effort: Pulmonary effort is normal. No respiratory distress.  Abdominal:     General: There is no distension.     Palpations: Abdomen is soft.     Tenderness: There is no abdominal tenderness. There is no guarding or rebound.  Musculoskeletal: Normal range of motion.     Left elbow: Normal.     Left wrist: Normal.     Comments: Left hand: 1.5 cm laceration of the first webspace of the left hand as seen in picture below.  Approximately 4 mm in depth small amount of subcutaneous tissue seen in the base.  Clean without evidence of foreign body.  Patient  denies pain. No snuffbox tenderness to palpation. No tenderness to palpation over flexor sheath.  Finger adduction/abduction intact with 5/5 strength.  Thumb opposition intact. Full active and resisted ROM to flexion/extension at wrist, MCP, PIP and DIP of all fingers.  FDS/FDP intact. Grip 5/5 strength.  Radial artery 2+ with <2sec cap refill in all fingers.  Sensation intact to light-tough in median/ulnar/radial distributions.  Compartments soft to palpation. - Appropriate range of motion and strength at the wrist and elbow without pain.  Skin:    General: Skin is warm and dry.  Neurological:     Mental Status: He is alert.     GCS: GCS eye subscore is 4. GCS verbal subscore is 5. GCS motor subscore is 6.     Comments: Speech is clear and goal oriented, follows commands Major Cranial nerves without deficit, no facial droop Moves extremities without ataxia, coordination intact  Psychiatric:        Behavior: Behavior normal.        ED Treatments / Results  Labs (all labs ordered are listed, but only abnormal results are displayed) Labs Reviewed - No data to display  EKG None  Radiology Dg Hand Complete Left  Result Date: 08/06/2019 CLINICAL DATA:  Left hand laceration EXAM: LEFT HAND - COMPLETE 3+ VIEW COMPARISON:  None. FINDINGS: There is no evidence of fracture or dislocation. There is no evidence of arthropathy or other focal bone abnormality. Small amount of air within the first inter web space compatible with reported history of laceration. No radiopaque foreign body. IMPRESSION: Soft tissue laceration without fracture or radiopaque foreign body. Electronically Signed   By: Davina Poke M.D.   On: 08/06/2019 17:28    Procedures .Marland KitchenLaceration Repair  Date/Time: 08/06/2019 7:58 PM Performed by: Deliah Boston, PA-C Authorized by: Deliah Boston, PA-C   Consent:    Consent obtained:  Verbal   Consent given by:  Patient   Risks discussed:  Infection, need for  additional repair, nerve damage, poor wound healing, poor cosmetic result, pain, retained foreign body, tendon damage and vascular damage Anesthesia (see MAR for exact dosages):    Anesthesia method:  Local infiltration   Local anesthetic:  Lidocaine 1% w/o epi Laceration details:    Location:  Finger   Finger location:  L thumb   Length (cm):  1.5   Depth (mm):  4 Repair type:    Repair type:  Simple Pre-procedure details:    Preparation:  Patient was prepped and draped in usual sterile fashion  and imaging obtained to evaluate for foreign bodies Exploration:    Hemostasis achieved with:  Direct pressure   Wound exploration: wound explored through full range of motion and entire depth of wound probed and visualized     Wound extent: no foreign bodies/material noted, no muscle damage noted, no nerve damage noted, no tendon damage noted, no underlying fracture noted and no vascular damage noted     Contaminated: no   Treatment:    Area cleansed with:  Betadine and saline   Irrigation solution:  Sterile saline   Irrigation volume:  1L   Irrigation method:  Pressure wash Skin repair:    Repair method:  Sutures   Suture size:  4-0   Suture material:  Prolene   Suture technique:  Simple interrupted   Number of sutures:  3 Approximation:    Approximation:  Close Post-procedure details:    Dressing:  Antibiotic ointment, non-adherent dressing and sterile dressing   Patient tolerance of procedure:  Tolerated well, no immediate complications Comments:     Dressing by nursing staff.  Capillary refill and sensation intact post procedure.  Full range of motion and appropriate strength with all movements of the thumb, fingers and hand post procedure without pain.   (including critical care time)  Medications Ordered in ED Medications  lidocaine (PF) (XYLOCAINE) 1 % injection 5 mL (has no administration in time range)     Initial Impression / Assessment and Plan / ED Course  I have  reviewed the triage vital signs and the nursing notes.  Pertinent labs & imaging results that were available during my care of the patient were reviewed by me and considered in my medical decision making (see chart for details).  Clinical Course as of Aug 05 2029  Wed Aug 06, 2019  1805 Tillie Rung RN for Dr. Caralyn Guile   [BM]    Clinical Course User Index [BM] Deliah Boston, PA-C   Chart review shows patient received Tdap in May 2017, he is up-to-date, patient reports that he remembers that this occurred.  DG Left Hand:   IMPRESSION:  Soft tissue laceration without fracture or radiopaque foreign body.  - Consult placed to hand surgery, discussed case with Tillie Rung RN for hand surgeon Dr. Caralyn Guile as Dr. Caralyn Guile was performing a case.  Advised that patient may be repaired here in the ER. - Wound thoroughly cleaned in the ED, explored and bottom of wound visualized in a bloodless field.  No evidence of tendon involvement or any major nerve/vessels.  Laceration repaired as dictated above.  No indication for antibiotics at this time. Patient counseled on home wound care. Follow up with PCP/urgent care or return to ER for suture removal in 7 days. Patient was urged to return to the Emergency Department for worsening pain, swelling, expanding erythema especially if it streaks away from the affected area, fever, or for any additional concerns.  Post procedure patient with full range of motion and appropriate strength with all movements, capillary refill and sensation intact.  He has been given referral to hand surgery as needed and encouraged to follow-up with PCP next week.  At this time there does not appear to be any evidence of an acute emergency medical condition and the patient appears stable for discharge with appropriate outpatient follow up. Diagnosis was discussed with patient who verbalizes understanding of care plan and is agreeable to discharge. I have discussed return precautions with  patient who verbalizes understanding of return precautions. Patient encouraged to follow-up  with their PCP and hand. All questions answered.  Patient's case discussed with Dr. Rogene Houston who agrees with plan.  Note: Portions of this report may have been transcribed using voice recognition software. Every effort was made to ensure accuracy; however, inadvertent computerized transcription errors may still be present. Final Clinical Impressions(s) / ED Diagnoses   Final diagnoses:  Laceration of left hand without foreign body, initial encounter    ED Discharge Orders    None       Gari Crown 08/06/19 2031    Fredia Sorrow, MD 08/07/19 [phone]

## 2019-08-06 NOTE — ED Triage Notes (Signed)
Pt comes in with laceration to left thumb. No bleeding present in triage. Last tetanus unknown.

## 2019-08-06 NOTE — Discharge Instructions (Addendum)
You have been diagnosed today with Left Hand Laceration.  At this time there does not appear to be the presence of an emergent medical condition, however there is always the potential for conditions to change. Please read and follow the below instructions.  Please return to the Emergency Department immediately for any new or worsening symptoms. Please be sure to follow up with your Primary Care Provider within one week regarding your visit today; please call their office to schedule an appointment even if you are feeling better for a follow-up visit. Your sutures need to be removed in 7 days.  They may be removed by an urgent care, your primary care doctor or here at the emergency department.  You may clean the area gently with a clean soapy rag and apply a small amount of antibiotic ointment daily.  Monitor for signs of infection and return to the ER immediately if they occur. You may follow-up with a hand specialist Dr. Apolonio Schneiders on your discharge paperwork if you develop any problems related to your injury.  Get help right away if: You have very bad swelling around your wound. You have pus or a bad smell coming from your wound. Your pain suddenly gets worse and is very bad. You have painful lumps near your wound or anywhere on your body. You have a red streak going away from your wound. The wound is on your hand or foot, and: You cannot move a finger or toe as you used to do. Your fingers or toes look pale or blue. You have numbness that spreads down your hand, foot, fingers, or toes. You have fever or chills You have any new/concerning or worsening of symptoms  Please read the additional information packets attached to your discharge summary.  Do not take your medicine if  develop an itchy rash, swelling in your mouth or lips, or difficulty breathing; call 911 and seek immediate emergency medical attention if this occurs.  Note: Portions of this text may have been transcribed using voice  recognition software. Every effort was made to ensure accuracy; however, inadvertent computerized transcription errors may still be present.

## 2019-11-17 DIAGNOSIS — R972 Elevated prostate specific antigen [PSA]: Secondary | ICD-10-CM | POA: Diagnosis not present

## 2020-02-27 DIAGNOSIS — E782 Mixed hyperlipidemia: Secondary | ICD-10-CM | POA: Diagnosis not present

## 2020-02-27 DIAGNOSIS — E663 Overweight: Secondary | ICD-10-CM | POA: Diagnosis not present

## 2020-02-27 DIAGNOSIS — Z0001 Encounter for general adult medical examination with abnormal findings: Secondary | ICD-10-CM | POA: Diagnosis not present

## 2020-02-27 DIAGNOSIS — Z6827 Body mass index (BMI) 27.0-27.9, adult: Secondary | ICD-10-CM | POA: Diagnosis not present

## 2020-02-27 DIAGNOSIS — Z1389 Encounter for screening for other disorder: Secondary | ICD-10-CM | POA: Diagnosis not present

## 2020-02-27 DIAGNOSIS — E7849 Other hyperlipidemia: Secondary | ICD-10-CM | POA: Diagnosis not present

## 2020-02-27 DIAGNOSIS — M722 Plantar fascial fibromatosis: Secondary | ICD-10-CM | POA: Diagnosis not present

## 2020-07-19 DIAGNOSIS — B078 Other viral warts: Secondary | ICD-10-CM | POA: Diagnosis not present

## 2020-09-09 IMAGING — DX DG HAND COMPLETE 3+V*L*
3 series · 3 of 3 positions shown · non-contrast
Comparison: None.

CLINICAL DATA: Left hand laceration

EXAM:
LEFT HAND - COMPLETE 3+ VIEW

[hand ap]
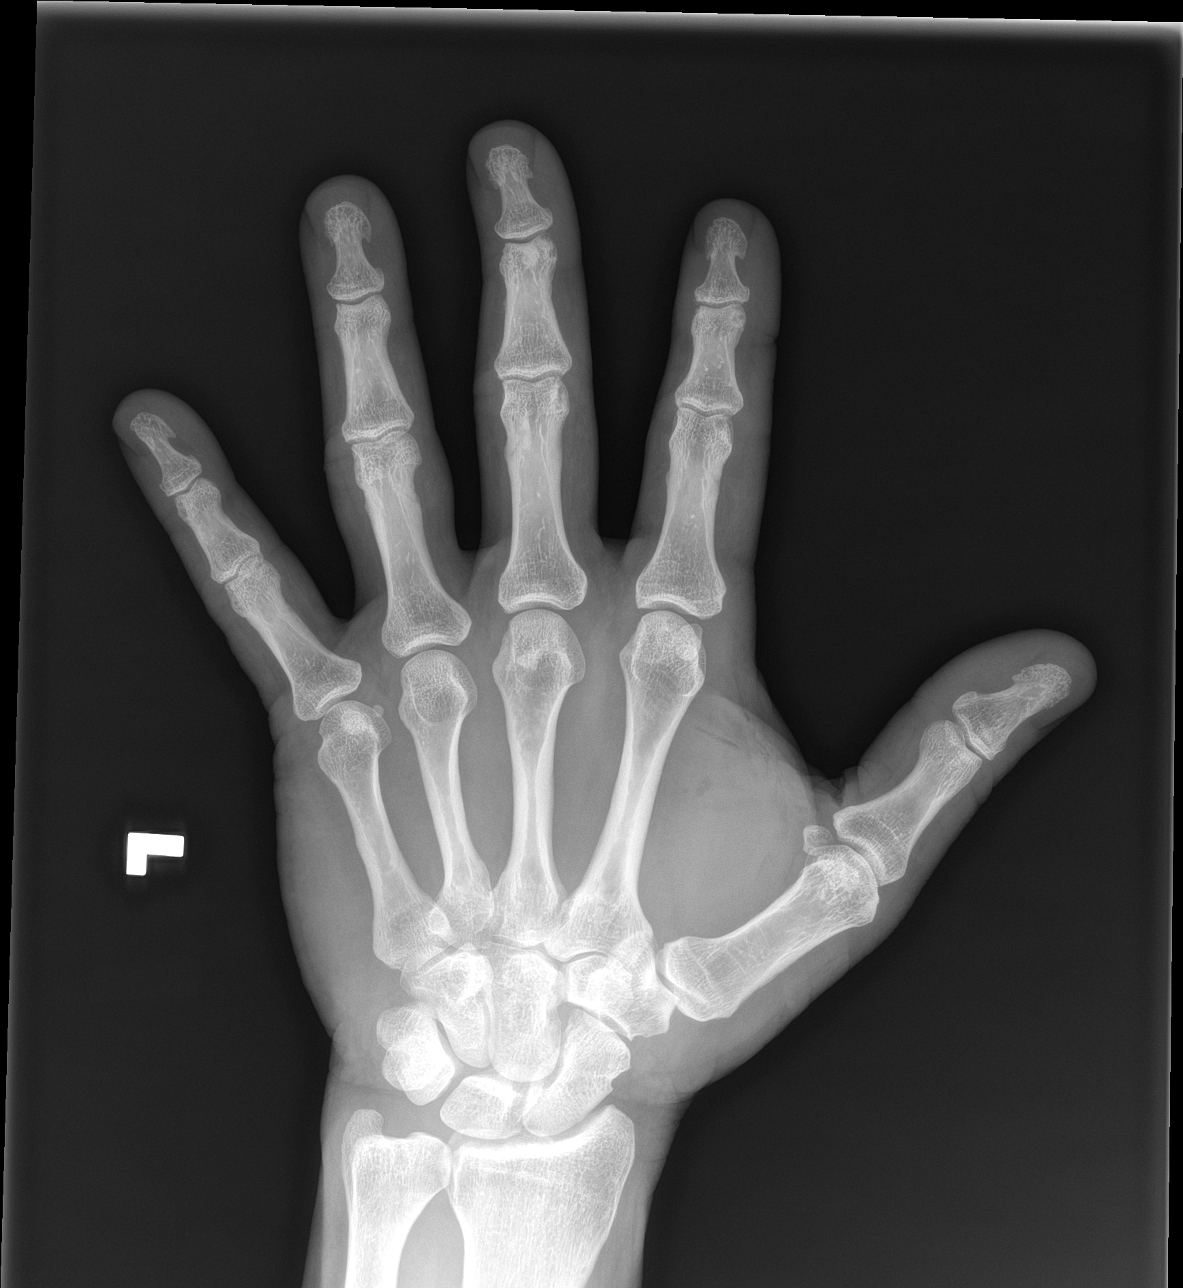

[hand obl]
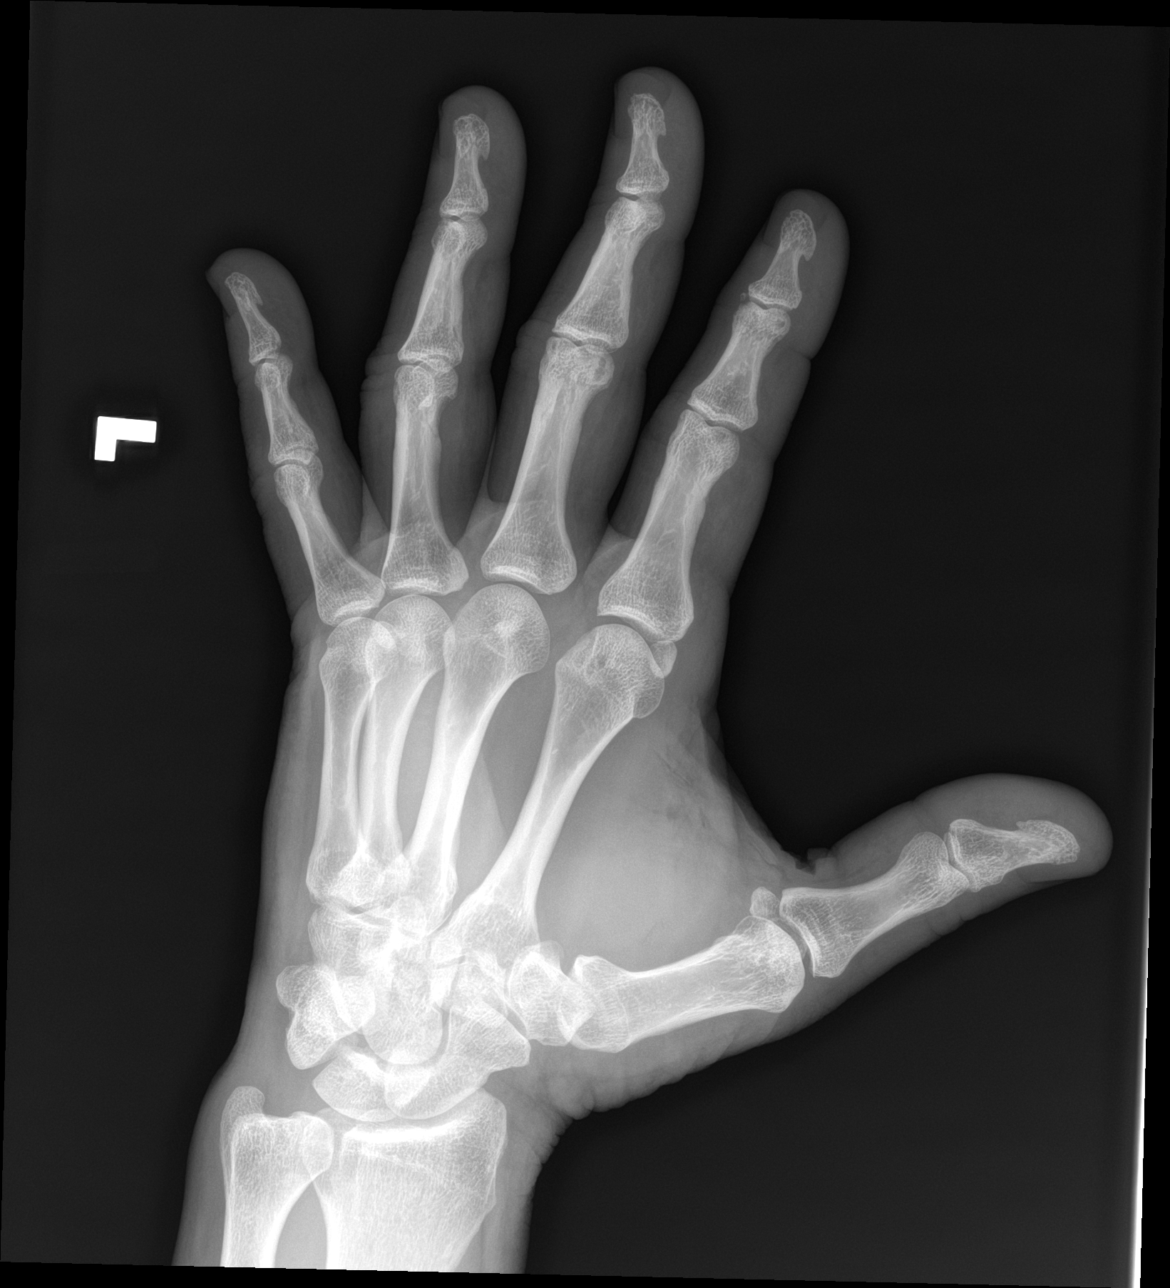

[hand lat]
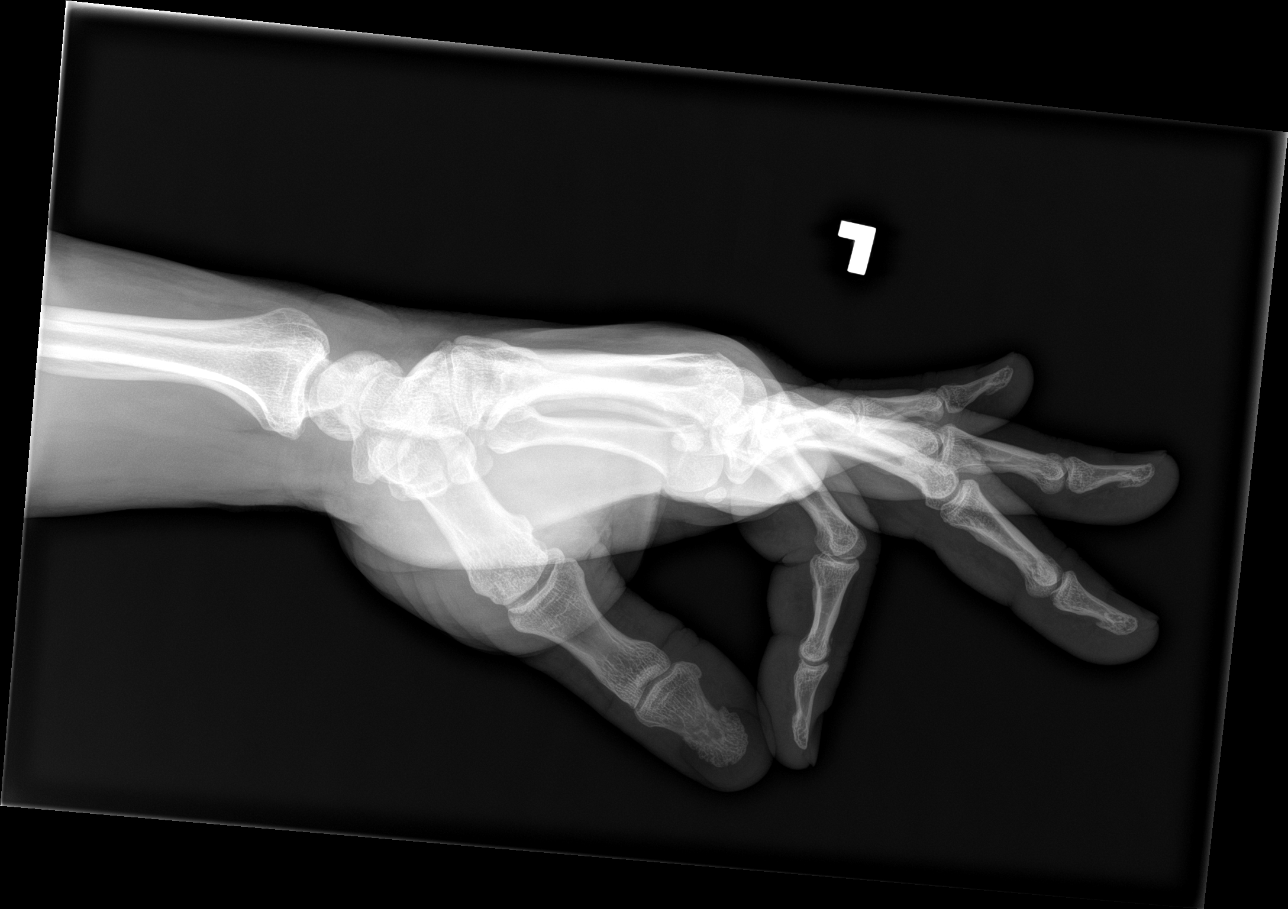

[3 of 3 positions shown; findings below may reference images not displayed]

FINDINGS: There is no evidence of fracture or dislocation. There is no
evidence of arthropathy or other focal bone abnormality. Small
amount of air within the first inter web space compatible with
reported history of laceration. No radiopaque foreign body.
IMPRESSION: Soft tissue laceration without fracture or radiopaque foreign body.

## 2020-11-18 DIAGNOSIS — R972 Elevated prostate specific antigen [PSA]: Secondary | ICD-10-CM | POA: Diagnosis not present

## 2021-08-26 DIAGNOSIS — Z0000 Encounter for general adult medical examination without abnormal findings: Secondary | ICD-10-CM | POA: Diagnosis not present

## 2021-08-26 DIAGNOSIS — Z1331 Encounter for screening for depression: Secondary | ICD-10-CM | POA: Diagnosis not present

## 2021-08-26 DIAGNOSIS — I10 Essential (primary) hypertension: Secondary | ICD-10-CM | POA: Diagnosis not present

## 2021-08-26 DIAGNOSIS — G43809 Other migraine, not intractable, without status migrainosus: Secondary | ICD-10-CM | POA: Diagnosis not present

## 2021-08-26 DIAGNOSIS — Z6825 Body mass index (BMI) 25.0-25.9, adult: Secondary | ICD-10-CM | POA: Diagnosis not present

## 2021-08-26 DIAGNOSIS — N029 Recurrent and persistent hematuria with unspecified morphologic changes: Secondary | ICD-10-CM | POA: Diagnosis not present

## 2021-08-26 DIAGNOSIS — E663 Overweight: Secondary | ICD-10-CM | POA: Diagnosis not present

## 2021-08-26 DIAGNOSIS — N200 Calculus of kidney: Secondary | ICD-10-CM | POA: Diagnosis not present

## 2021-08-26 DIAGNOSIS — Z125 Encounter for screening for malignant neoplasm of prostate: Secondary | ICD-10-CM | POA: Diagnosis not present

## 2021-08-26 DIAGNOSIS — E782 Mixed hyperlipidemia: Secondary | ICD-10-CM | POA: Diagnosis not present

## 2021-10-27 DIAGNOSIS — E7849 Other hyperlipidemia: Secondary | ICD-10-CM | POA: Diagnosis not present

## 2021-11-07 DIAGNOSIS — Z6825 Body mass index (BMI) 25.0-25.9, adult: Secondary | ICD-10-CM | POA: Diagnosis not present

## 2021-11-07 DIAGNOSIS — N289 Disorder of kidney and ureter, unspecified: Secondary | ICD-10-CM | POA: Diagnosis not present

## 2021-11-07 DIAGNOSIS — E663 Overweight: Secondary | ICD-10-CM | POA: Diagnosis not present

## 2021-11-24 DIAGNOSIS — R972 Elevated prostate specific antigen [PSA]: Secondary | ICD-10-CM | POA: Diagnosis not present

## 2021-11-28 DIAGNOSIS — Z86010 Personal history of colonic polyps: Secondary | ICD-10-CM | POA: Diagnosis not present

## 2021-11-28 DIAGNOSIS — K648 Other hemorrhoids: Secondary | ICD-10-CM | POA: Diagnosis not present

## 2021-11-28 DIAGNOSIS — D127 Benign neoplasm of rectosigmoid junction: Secondary | ICD-10-CM | POA: Diagnosis not present

## 2021-11-28 DIAGNOSIS — D124 Benign neoplasm of descending colon: Secondary | ICD-10-CM | POA: Diagnosis not present

## 2021-11-28 DIAGNOSIS — D120 Benign neoplasm of cecum: Secondary | ICD-10-CM | POA: Diagnosis not present

## 2021-11-28 DIAGNOSIS — D123 Benign neoplasm of transverse colon: Secondary | ICD-10-CM | POA: Diagnosis not present

## 2021-11-29 ENCOUNTER — Telehealth: Payer: Self-pay | Admitting: Genetic Counselor

## 2021-11-29 NOTE — Telephone Encounter (Signed)
Scheduled appt per 4/4 referral. Pt is aware of appt date and time. Pt is aware to arrive 15 mins prior to appt time and to bring and updated insurance card. Pt is aware of appt location.   ?

## 2022-01-02 ENCOUNTER — Other Ambulatory Visit: Payer: Self-pay

## 2022-01-02 ENCOUNTER — Inpatient Hospital Stay: Payer: BC Managed Care – PPO

## 2022-01-02 ENCOUNTER — Encounter: Payer: Self-pay | Admitting: Genetic Counselor

## 2022-01-02 ENCOUNTER — Inpatient Hospital Stay: Payer: BC Managed Care – PPO | Attending: Internal Medicine | Admitting: Genetic Counselor

## 2022-01-02 DIAGNOSIS — Z860100 Personal history of colon polyps, unspecified: Secondary | ICD-10-CM

## 2022-01-02 HISTORY — DX: Personal history of colonic polyps: Z86.010

## 2022-01-02 HISTORY — DX: Personal history of colon polyps, unspecified: Z86.0100

## 2022-01-02 LAB — GENETIC SCREENING ORDER

## 2022-01-02 NOTE — Progress Notes (Signed)
REFERRING PROVIDER: ?Otis Brace, MD ?[address] ?[place],  [place] [address] ? ?PRIMARY PROVIDER:  ?Sharilyn Sites, MD ? ?PRIMARY REASON FOR VISIT:  ?1. Personal history of colonic polyps   ? ? ?HISTORY OF PRESENT ILLNESS:   ?Mr. Beever, a 56 y.o. male, was seen for a [place] cancer genetics consultation at the request of Dr. Alessandra Bevels due to a personal history of colon polyps.  Mr. Pavlik presents to clinic today to discuss the possibility of a hereditary predisposition to cancer, to discuss genetic testing, and to further clarify his future cancer risks, as well as potential cancer risks for family members.  ? ?Mr. Elsass is a 56 y.o. male with no personal history of cancer.  He has had more than 20 lifetime coon polyps-- 3 polyps at age 59, 68 polyps at age 84, and 35 polyps at age 3.  No pathology report was available for review but it is reported that his polyps were tubular adenomas.  ? ?CANCER HISTORY:  ?Oncology History  ? No history exists.  ? ? ?RISK FACTORS:  ?Colonoscopy: yes;  see history noted above . ?PSA screening: yes; history of elevated PSA ?War-related exposures  ? ?Past Medical History:  ?Diagnosis Date  ? Migraines   ? Personal history of colonic polyps 01/02/2022  ? Tennis elbow   ? ? ?Past Surgical History:  ?Procedure Laterality Date  ? APPENDECTOMY    ? COLONOSCOPY N/A 11/18/2013  ? Procedure: COLONOSCOPY;  Surgeon: Jamesetta So, MD;  Location: [facility];  Service: Gastroenterology;  Laterality: N/A;  ? Exploratory Laparotomy with Appendectomy    ? TONSILLECTOMY    ? at age 26  ? ? ? ? ?FAMILY HISTORY:  ?We obtained a detailed, 4-generation family history.  Significant diagnoses are listed below: ?Family History  ?Problem Relation Age of Onset  ? Cancer Mother   ?     unknown type; dx after 55  ? Cancer Father   ?     unknown type; d. 50; war-related expsoures  ? ? ? ?Mr. Abdallah is unaware of previous family history of genetic testing for hereditary cancer risks.  There is no reported Ashkenazi Jewish ancestry. There is no known consanguinity. ? ?GENETIC COUNSELING ASSESSMENT: Mr. Graham is a 56 y.o. male with a personal history of colon polyps which is somewhat suggestive of a hereditary polyposis syndrome. We, therefore, discussed and recommended the following at today's visit.  ? ?DISCUSSION: We discussed that polyps in general are common, however, most people have fewer than 5 lifetime polyps.  When an individual has 10 or more polyps we become concerned about an underlying polyposis syndrome.  The most common hereditary polyposis syndromes are Familial Adenomatous Polyposis (FAP), caused by mutations in the APC gene, and MUTYH-Associated Polyposis (MAP), caused by mutations in the MUTYH gene.  There are other genes that are associated with polyposis.  We discussed that testing is beneficial for several reasons, including knowing about cancer risks, identifying potential screening and risk-reduction options that may be appropriate, and to understand if other family members could be at risk for colon polyps and/or cancer and allow them to undergo genetic testing. ?  ?We reviewed the characteristics, features and inheritance patterns of hereditary cancer syndromes. We also discussed genetic testing, including the appropriate family members to test, the process of testing, insurance coverage and turn-around-time for results. We discussed the implications of a negative, positive and/or variant of uncertain significant result. We recommended Mr. Powell pursue  genetic testing for genes associated with polyposis and other cancers.  ? ?Mr. Cueva was offered a common hereditary cancer panel (47 genes) and an expanded pan-cancer panel (77 genes). Mr. Furney was informed of the benefits and limitations of each panel, including that expanded pan-cancer panels contain several genes that do not have clear management guidelines at this point in time.  We also discussed that as the  number of genes included on a panel increases, the chances of variants of uncertain significance increases.  After considering the benefits and limitations of each gene panel, Mr. Halm elected to have an an expanded pan-cancer panel through [place]. ? ?The CancerNext-Expanded gene panel offered by [facility] and includes sequencing, rearrangement, and RNA analysis for the following 77 genes: AIP, ALK, APC, ATM, AXIN2, BAP1, BARD1, BLM, BMPR1A, BRCA1, BRCA2, BRIP1, CDC73, CDH1, CDK4, CDKN1B, CDKN2A, CHEK2, CTNNA1, DICER1, FANCC, FH, FLCN, GALNT12, KIF1B, LZTR1, MAX, MEN1, MET, MLH1, MSH2, MSH3, MSH6, MUTYH, NBN, NF1, NF2, NTHL1, PALB2, PHOX2B, PMS2, POT1, PRKAR1A, PTCH1, PTEN, RAD51C, RAD51D, RB1, RECQL, RET, SDHA, SDHAF2, SDHB, SDHC, SDHD, SMAD4, SMARCA4, SMARCB1, SMARCE1, STK11, SUFU, TMEM127, TP53, TSC1, TSC2, VHL and XRCC2 (sequencing and deletion/duplication); EGFR, EGLN1, HOXB13, KIT, MITF, PDGFRA, POLD1, and POLE (sequencing only); EPCAM and GREM1 (deletion/duplication only).  ? ?Based on Mr. Richens personal history of colon polyps, he meets medical criteria for genetic testing. Despite that he meets criteria, he may still have an out of pocket cost. We discussed that if his out of pocket cost for testing is over $100, the laboratory should contact him to discuss self-pay options and/or patient pay assistance programs.  ? ?We discussed that some people do not want to undergo genetic testing due to fear of genetic discrimination.  A federal law called the Genetic Information Non-Discrimination Act (GINA) of 2008 helps protect individuals against genetic discrimination based on their genetic test results.  It impacts both health insurance and employment.  With health insurance, it protects against increased premiums, being kicked off insurance or being forced to take a test in order to be insured.  For employment it protects against hiring, firing and promoting decisions based on genetic test results.   GINA does not apply to those in the [employer], those who work for companies with less than 15 employees, and new life insurance or long-term disability insurance policies.  Health status due to a cancer diagnosis is not protected under GINA. ? ?PLAN: After considering the risks, benefits, and limitations, Mr. Kinney provided informed consent to pursue genetic testing and the blood sample was sent to [facility] for analysis of the CancerNext-Expanded +RNAinsight Panel. Results should be available within approximately 3 weeks' time, at which point they will be disclosed by telephone to Mr. Rana, as will any additional recommendations warranted by these results. Mr. Sikora will receive a summary of his genetic counseling visit and a copy of his results once available. This information will also be available in Epic.  ? ?Lastly, we encouraged Mr. Sonn to remain in contact with cancer genetics annually so that we can continuously update the family history and inform him of any changes in cancer genetics and testing that may be of benefit for this family.  ? ?Mr. Rozycki's questions were answered to his satisfaction today. Our contact information was provided should additional questions or concerns arise. Thank you for the referral and allowing [place] to share in the care of your patient.  ? ?[facility] M. Joette Catching, [place], [place] ?Genetic Counselor ?[email or web address] ?(P) [phone]  ? ?  The patient was seen for a total of 25 minutes in face-to-face genetic counseling.   Drs. Lindi Adie and/or Burr Medico were available to discuss this case as needed.  ?_______________________________________________________________________ ?For Office Staff:  ?Number of people involved in session: 1 ?Was an Intern/ student involved with case: no ? ? ?

## 2022-01-17 ENCOUNTER — Telehealth: Payer: Self-pay | Admitting: Genetic Counselor

## 2022-01-17 ENCOUNTER — Ambulatory Visit: Payer: Self-pay | Admitting: Genetic Counselor

## 2022-01-17 ENCOUNTER — Encounter: Payer: Self-pay | Admitting: Genetic Counselor

## 2022-01-17 DIAGNOSIS — Z1379 Encounter for other screening for genetic and chromosomal anomalies: Secondary | ICD-10-CM

## 2022-01-17 DIAGNOSIS — Z86010 Personal history of colonic polyps: Secondary | ICD-10-CM

## 2022-01-17 NOTE — Telephone Encounter (Signed)
Revealed negative genetic testing.  Discussed that we do not know why he has colon polyps. It could be sporadic/famillial, due to a different gene that we are not testing, or maybe our current technology may not be able to pick something up.  It will be important for him to keep in contact with genetics to keep up with whether additional testing may be needed.

## 2022-01-17 NOTE — Progress Notes (Signed)
HPI:   Mr. Cosman was previously seen in the [place] clinic due to a personal history of polyps and concerns regarding a hereditary predisposition to cancer. Please refer to our prior cancer genetics clinic note for more information regarding our discussion, assessment and recommendations, at the time. Mr. Bertram recent genetic test results were disclosed to him, as were recommendations warranted by these results. These results and recommendations are discussed in more detail below.  CANCER HISTORY:  Mr. Beever is a 56 y.o. male with no personal history of cancer.  He has had more than 20 lifetime coon polyps-- 3 polyps at age 87, 63 polyps at age 36, and 79 polyps at age 48.  No pathology report was available for review but it is reported that his polyps were tubular adenomas.   FAMILY HISTORY:  We obtained a detailed, 4-generation family history.  Significant diagnoses are listed below: Family History  Problem Relation Age of Onset   Cancer Mother        unknown type; dx after 28   Cancer Father        unknown type; d. 80; war-related expsoures      Mr. Symonette is unaware of previous family history of genetic testing for hereditary cancer risks. There is no reported Ashkenazi Jewish ancestry. There is no known consanguinity.  GENETIC TEST RESULTS:  The [facility] CancerNext-Expanded +RNAinsight Panel found no pathogenic mutations.   The CancerNext-Expanded gene panel offered by [facility] and includes sequencing, rearrangement, and RNA analysis for the following 77 genes: AIP, ALK, APC, ATM, AXIN2, BAP1, BARD1, BLM, BMPR1A, BRCA1, BRCA2, BRIP1, CDC73, CDH1, CDK4, CDKN1B, CDKN2A, CHEK2, CTNNA1, DICER1, FANCC, FH, FLCN, GALNT12, KIF1B, LZTR1, MAX, MEN1, MET, MLH1, MSH2, MSH3, MSH6, MUTYH, NBN, NF1, NF2, NTHL1, PALB2, PHOX2B, PMS2, POT1, PRKAR1A, PTCH1, PTEN, RAD51C, RAD51D, RB1, RECQL, RET, SDHA, SDHAF2, SDHB, SDHC, SDHD, SMAD4, SMARCA4, SMARCB1, SMARCE1, STK11, SUFU,  TMEM127, TP53, TSC1, TSC2, VHL and XRCC2 (sequencing and deletion/duplication); EGFR, EGLN1, HOXB13, KIT, MITF, PDGFRA, POLD1, and POLE (sequencing only); EPCAM and GREM1 (deletion/duplication only).   The test report has been scanned into EPIC and is located under the Molecular Pathology section of the Results Review tab.  A portion of the result report is included below for reference. Genetic testing reported out on Jan 12, 2022.      Even though a pathogenic variant was not identified, possible explanations for the polyps in the family may include: There may be no hereditary risk for cancer/ polyps in the family. The polyps in Mr. Woodrick may be sporadic/familial or due to other genetic and environmental factors. There may be a gene mutation in one of these genes that current testing methods cannot detect but that chance is small. There could be another gene that has not yet been discovered, or that we have not yet tested, that is responsible for the cancer diagnoses in the family.   Therefore, it is important to remain in touch with cancer genetics in the future so that we can continue to offer Mr. Sebring the most up to date genetic testing.   ADDITIONAL GENETIC TESTING:  We discussed with Mr. Murakami that his genetic testing was fairly extensive.  If there are additional relevant genes identified to increase cancer risk that can be analyzed in the future, we would be happy to discuss and coordinate this testing at that time.    CANCER SCREENING RECOMMENDATIONS:  Mr. Holderness test result is considered negative (normal).  This means that we  have not identified a hereditary cause for his personal history of colon polyps at this time.   An individual's cancer risk and medical management are not determined by genetic test results alone. Overall cancer risk assessment incorporates additional factors, including personal medical history, family history, and any available genetic information that  may result in a personalized plan for cancer prevention and surveillance. Therefore, it is recommended he continue to follow the cancer management and screening guidelines provided by his gastroenterologist and primary healthcare provider.  RECOMMENDATIONS FOR FAMILY MEMBERS:   Since he did not inherit a identifiable mutation in a cancer predisposition gene included on this panel, his children could not have inherited a known mutation from him in one of these genes. Individuals in this family might be at some increased risk of developing cancer, over the general population risk, due to the family history of cancer.  Individuals in the family should notify their providers of the family history of cancer and colon polyps.  Family members should have colonoscopies as recommended by their providers.   FOLLOW-UP:  Lastly, we discussed with Mr. Enriques that cancer genetics is a rapidly advancing field and it is possible that new genetic tests will be appropriate for him and/or his family members in the future. We encouraged him to remain in contact with cancer genetics on an annual basis so we can update his personal and family histories and let him know of advances in cancer genetics that may benefit this family.   Our contact number was provided. Mr. Cambria questions were answered to his satisfaction, and he knows he is welcome to call us at anytime with additional questions or concerns.   Bunnie Lederman M. Joette Catching, [place], [facility] Genetic Counselor [email or web address] (P) [phone]
